# Patient Record
Sex: Female | Born: 1965 | Race: Black or African American | Hispanic: No | Marital: Single
Health system: Southern US, Community
[De-identification: ages and names within clinical notes are randomized; demographics above are authoritative.]

---

## 1998-04-12 ENCOUNTER — Ambulatory Visit (HOSPITAL_COMMUNITY): Admission: RE | Admit: 1998-04-12 | Discharge: 1998-04-12 | Payer: Self-pay | Admitting: Obstetrics and Gynecology

## 1998-08-31 ENCOUNTER — Inpatient Hospital Stay (HOSPITAL_COMMUNITY): Admission: AD | Admit: 1998-08-31 | Discharge: 1998-08-31 | Payer: Self-pay | Admitting: Obstetrics and Gynecology

## 1998-09-01 ENCOUNTER — Inpatient Hospital Stay (HOSPITAL_COMMUNITY): Admission: AD | Admit: 1998-09-01 | Discharge: 1998-09-01 | Payer: Self-pay | Admitting: Obstetrics and Gynecology

## 1998-09-04 ENCOUNTER — Inpatient Hospital Stay (HOSPITAL_COMMUNITY): Admission: AD | Admit: 1998-09-04 | Discharge: 1998-09-07 | Payer: Self-pay | Admitting: Obstetrics and Gynecology

## 1998-09-12 ENCOUNTER — Inpatient Hospital Stay (HOSPITAL_COMMUNITY): Admission: AD | Admit: 1998-09-12 | Discharge: 1998-09-14 | Payer: Self-pay | Admitting: Obstetrics and Gynecology

## 1998-09-12 ENCOUNTER — Encounter: Payer: Self-pay | Admitting: Pulmonary Disease

## 1998-09-12 ENCOUNTER — Encounter: Payer: Self-pay | Admitting: Obstetrics and Gynecology

## 1998-09-13 ENCOUNTER — Encounter: Payer: Self-pay | Admitting: Obstetrics and Gynecology

## 1998-09-13 ENCOUNTER — Encounter: Payer: Self-pay | Admitting: Pulmonary Disease

## 2000-08-14 ENCOUNTER — Other Ambulatory Visit: Admission: RE | Admit: 2000-08-14 | Discharge: 2000-08-14 | Payer: Self-pay | Admitting: Gynecology

## 2000-12-24 ENCOUNTER — Encounter: Admission: RE | Admit: 2000-12-24 | Discharge: 2000-12-24 | Payer: Self-pay | Admitting: Gynecology

## 2000-12-24 ENCOUNTER — Encounter: Payer: Self-pay | Admitting: Gynecology

## 2004-06-13 ENCOUNTER — Emergency Department (HOSPITAL_COMMUNITY): Admission: EM | Admit: 2004-06-13 | Discharge: 2004-06-14 | Payer: Self-pay | Admitting: Emergency Medicine

## 2005-10-23 ENCOUNTER — Other Ambulatory Visit: Admission: RE | Admit: 2005-10-23 | Discharge: 2005-10-23 | Payer: Self-pay | Admitting: Gynecology

## 2007-01-07 ENCOUNTER — Other Ambulatory Visit: Admission: RE | Admit: 2007-01-07 | Discharge: 2007-01-07 | Payer: Self-pay | Admitting: Gynecology

## 2007-03-02 ENCOUNTER — Encounter: Admission: RE | Admit: 2007-03-02 | Discharge: 2007-03-02 | Payer: Self-pay | Admitting: Family Medicine

## 2008-06-09 ENCOUNTER — Other Ambulatory Visit: Admission: RE | Admit: 2008-06-09 | Discharge: 2008-06-09 | Payer: Self-pay | Admitting: Gynecology

## 2008-06-09 ENCOUNTER — Encounter: Payer: Self-pay | Admitting: Women's Health

## 2008-06-09 ENCOUNTER — Ambulatory Visit: Payer: Self-pay | Admitting: Women's Health

## 2008-10-23 ENCOUNTER — Encounter (INDEPENDENT_AMBULATORY_CARE_PROVIDER_SITE_OTHER): Payer: Self-pay | Admitting: Surgery

## 2008-10-23 ENCOUNTER — Ambulatory Visit (HOSPITAL_COMMUNITY): Admission: RE | Admit: 2008-10-23 | Discharge: 2008-10-24 | Payer: Self-pay | Admitting: Surgery

## 2009-03-16 ENCOUNTER — Ambulatory Visit: Payer: Self-pay | Admitting: Women's Health

## 2009-09-19 ENCOUNTER — Ambulatory Visit: Payer: Self-pay | Admitting: Women's Health

## 2009-09-19 ENCOUNTER — Other Ambulatory Visit: Admission: RE | Admit: 2009-09-19 | Discharge: 2009-09-19 | Payer: Self-pay | Admitting: Gynecology

## 2009-10-04 ENCOUNTER — Ambulatory Visit: Payer: Self-pay | Admitting: Gynecology

## 2009-11-29 ENCOUNTER — Encounter: Admission: RE | Admit: 2009-11-29 | Discharge: 2009-11-29 | Payer: Self-pay | Admitting: Family Medicine

## 2010-07-31 ENCOUNTER — Ambulatory Visit
Admission: RE | Admit: 2010-07-31 | Discharge: 2010-07-31 | Payer: Self-pay | Source: Home / Self Care | Attending: Women's Health | Admitting: Women's Health

## 2010-09-23 ENCOUNTER — Encounter: Payer: Self-pay | Admitting: Women's Health

## 2010-10-03 ENCOUNTER — Other Ambulatory Visit: Payer: Self-pay | Admitting: Women's Health

## 2010-10-03 ENCOUNTER — Other Ambulatory Visit (HOSPITAL_COMMUNITY)
Admission: RE | Admit: 2010-10-03 | Discharge: 2010-10-03 | Disposition: A | Discharge status: Home / Self Care | Payer: BC Managed Care – PPO | Source: Ambulatory Visit | Attending: Gynecology | Admitting: Gynecology

## 2010-10-03 ENCOUNTER — Encounter (INDEPENDENT_AMBULATORY_CARE_PROVIDER_SITE_OTHER): Payer: BC Managed Care – PPO | Admitting: Women's Health

## 2010-10-03 DIAGNOSIS — Z01419 Encounter for gynecological examination (general) (routine) without abnormal findings: Secondary | ICD-10-CM

## 2010-10-03 DIAGNOSIS — Z124 Encounter for screening for malignant neoplasm of cervix: Secondary | ICD-10-CM | POA: Insufficient documentation

## 2010-10-16 LAB — URINALYSIS, ROUTINE W REFLEX MICROSCOPIC
Bilirubin Urine: NEGATIVE
Glucose, UA: NEGATIVE mg/dL
Hgb urine dipstick: NEGATIVE
Ketones, ur: NEGATIVE mg/dL
Nitrite: NEGATIVE
Protein, ur: NEGATIVE mg/dL
Specific Gravity, Urine: 1.017 (ref 1.005–1.030)
Urobilinogen, UA: 0.2 mg/dL (ref 0.0–1.0)
pH: 6.5 (ref 5.0–8.0)

## 2010-10-16 LAB — BASIC METABOLIC PANEL
BUN: 8 mg/dL (ref 6–23)
CO2: 28 mEq/L (ref 19–32)
Calcium: 10.8 mg/dL — ABNORMAL HIGH (ref 8.4–10.5)
Chloride: 107 mEq/L (ref 96–112)
Creatinine, Ser: 0.7 mg/dL (ref 0.4–1.2)
GFR calc Af Amer: >60 mL/min (ref >60)
GFR calc non Af Amer: >60 mL/min (ref >60)
Glucose, Bld: 108 mg/dL — ABNORMAL HIGH (ref 70–99)
Potassium: 4 mEq/L (ref 3.5–5.1)
Sodium: 140 mEq/L (ref 135–145)

## 2010-10-16 LAB — CBC
HCT: 35.3 % — ABNORMAL LOW (ref 36.0–46.0)
Hemoglobin: 11.5 g/dL — ABNORMAL LOW (ref 12.0–15.0)
MCHC: 32.7 g/dL (ref 30.0–36.0)
MCV: 80.1 fL (ref 78.0–100.0)
Platelets: 252 10*3/uL (ref 150–400)
RBC: 4.41 MIL/uL (ref 3.87–5.11)
RDW: 13.3 % (ref 11.5–15.5)
WBC: 5.9 10*3/uL (ref 4.0–10.5)

## 2010-10-16 LAB — DIFFERENTIAL
Basophils Absolute: 0 10*3/uL (ref 0.0–0.1)
Basophils Relative: 1 % (ref 0–1)
Eosinophils Absolute: 0.1 10*3/uL (ref 0.0–0.7)
Eosinophils Relative: 2 % (ref 0–5)
Lymphocytes Relative: 41 % (ref 12–46)
Lymphs Abs: 2.4 10*3/uL (ref 0.7–4.0)
Monocytes Absolute: 0.3 10*3/uL (ref 0.1–1.0)
Monocytes Relative: 5 % (ref 3–12)
Neutro Abs: 3 10*3/uL (ref 1.7–7.7)
Neutrophils Relative %: 51 % (ref 43–77)

## 2010-10-16 LAB — CALCIUM
Calcium: 9 mg/dL (ref 8.4–10.5)
Calcium: 9.9 mg/dL (ref 8.4–10.5)

## 2010-10-16 LAB — URINE MICROSCOPIC-ADD ON

## 2010-10-16 LAB — PROTIME-INR
INR: 1 (ref 0.00–1.49)
Prothrombin Time: 13.2 seconds (ref 11.6–15.2)

## 2010-10-16 LAB — GLUCOSE, CAPILLARY: Glucose-Capillary: 93 mg/dL (ref 70–99)

## 2010-11-19 NOTE — Op Note (Signed)
Paula Blair, WHITMIRE NO.:  [id number]   MEDICAL RECORD NO.:  [id number]          PATIENT TYPE:  OIB   LOCATION:  5118                         FACILITY:  [facility]   PHYSICIAN:  Velora Heckler, MD      DATE OF BIRTH:  1965-08-28   DATE OF PROCEDURE:  10/23/2008  DATE OF DISCHARGE:                               OPERATIVE REPORT   PREOPERATIVE DIAGNOSIS:  Primary hyperparathyroidism.   POSTOPERATIVE DIAGNOSIS:  Primary hyperparathyroidism.   PROCEDURE:  Left superior minimally invasive parathyroidectomy.   SURGEON:  Velora Heckler, MD, FACS   ASSISTANT:  Magnus Ivan, RNFA   ANESTHESIA:  General.   ESTIMATED BLOOD LOSS:  Minimal.   PREPARATION:  ChloraPrep.   COMPLICATIONS:  None.   INDICATIONS:  The patient is a 45 year old black female known to my  practice.  She had been previously evaluated in October 2008.  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  The patient had  deferred having surgery at that time.  She returned in January 2010 and  decided to proceed with surgical resection.  She now comes to the  operating room for minimally invasive parathyroidectomy.   BODY OF REPORT:  Procedure was done in OR #60 at the [facility].  The patient was brought to the operating room and  placed in supine position on the operating room table.  Following  administration of general anesthesia, the patient was positioned and  then prepped and draped in usual strict aseptic fashion.  After  ascertaining that an adequate level of anesthesia had been achieved, a  left neck incision was made with a #15 blade.  Dissection was carried  down through subcutaneous tissues and platysma.  Hemostasis was obtained  with electrocautery.  Subplatysmal flaps were developed  circumferentially and a Weitlaner retractor was placed for exposure.  Strap muscles were incised into the midline and reflected to the  patient's left.  Left thyroid  lobe was exposed.  It was gently mobilized  with blunt dissection.  Immediately posterior to the superior pole of  the left lobe, there was an abnormally enlarged parathyroid gland.  This  measures approximately 2.5 cm in greatest dimension.  It was gently  dissected out.  Vascular structures were divided between Ligaclips.  The  remainder of the gland was mobilized using the electrocautery for  hemostasis and it was completely excised in its entirety.  It was  submitted to pathology for review.  Dr. Guerry Bruin confirms parathyroid  tissue consistent with parathyroid adenoma.   Neck was irrigated with warm saline.  Good hemostasis was noted.  Surgicel was placed in the operative field.  Strap muscles were  reapproximated in the midline with interrupted 3-0 Vicryl sutures.  Platysma was closed with interrupted 3-0 Vicryl sutures.  Skin was  closed with a running 4-0 Monocryl subcuticular suture.  Local block was  placed with Marcaine.  Benzoin and Steri-Strips were applied.  Sterile  dressings were applied.  The patient was awakened from anesthesia and  brought to the recovery room in stable condition.  The patient tolerated  the procedure well.      Velora Heckler, MD  Electronically Signed     TMG/MEDQ  D:  10/23/2008  T:  10/24/2008  Job:  [id number]   cc:   Duncan Dull, M.D.

## 2010-12-16 ENCOUNTER — Other Ambulatory Visit: Payer: Self-pay | Admitting: Family Medicine

## 2010-12-16 DIAGNOSIS — Z1231 Encounter for screening mammogram for malignant neoplasm of breast: Secondary | ICD-10-CM

## 2010-12-23 ENCOUNTER — Ambulatory Visit
Admission: RE | Admit: 2010-12-23 | Discharge: 2010-12-23 | Disposition: A | Discharge status: Home / Self Care | Payer: BC Managed Care – PPO | Source: Ambulatory Visit | Attending: Family Medicine | Admitting: Family Medicine

## 2010-12-23 DIAGNOSIS — Z1231 Encounter for screening mammogram for malignant neoplasm of breast: Secondary | ICD-10-CM

## 2011-01-06 ENCOUNTER — Other Ambulatory Visit: Payer: Self-pay | Admitting: Gynecology

## 2011-01-06 DIAGNOSIS — R7989 Other specified abnormal findings of blood chemistry: Secondary | ICD-10-CM

## 2011-01-11 ENCOUNTER — Ambulatory Visit
Admission: RE | Admit: 2011-01-11 | Discharge: 2011-01-11 | Disposition: A | Discharge status: Home / Self Care | Payer: BC Managed Care – PPO | Source: Ambulatory Visit | Attending: Gynecology | Admitting: Gynecology

## 2011-01-11 DIAGNOSIS — R7989 Other specified abnormal findings of blood chemistry: Secondary | ICD-10-CM

## 2011-01-11 MED ORDER — GADOBENATE DIMEGLUMINE 529 MG/ML IV SOLN
10.0000 mL | Freq: Once | INTRAVENOUS | Status: AC | PRN
  Administered 2011-01-11: 10 mL via INTRAVENOUS

## 2011-01-15 ENCOUNTER — Other Ambulatory Visit: Payer: Self-pay | Admitting: Gynecology

## 2011-04-17 ENCOUNTER — Other Ambulatory Visit: Payer: Self-pay | Admitting: Women's Health

## 2011-04-30 ENCOUNTER — Encounter (HOSPITAL_COMMUNITY)
Admission: RE | Admit: 2011-04-30 | Discharge: 2011-04-30 | Disposition: A | Discharge status: Home / Self Care | Payer: BC Managed Care – PPO | Source: Ambulatory Visit | Attending: Neurosurgery | Admitting: Neurosurgery

## 2011-04-30 ENCOUNTER — Telehealth: Payer: Self-pay | Admitting: *Deleted

## 2011-04-30 ENCOUNTER — Other Ambulatory Visit (HOSPITAL_COMMUNITY): Payer: Self-pay | Admitting: Neurosurgery

## 2011-04-30 DIAGNOSIS — D497 Neoplasm of unspecified behavior of endocrine glands and other parts of nervous system: Secondary | ICD-10-CM

## 2011-04-30 LAB — BASIC METABOLIC PANEL
BUN: 14 mg/dL (ref 6–23)
Calcium: 10 mg/dL (ref 8.4–10.5)
Creatinine, Ser: 0.87 mg/dL (ref 0.50–1.10)
GFR calc non Af Amer: 80 mL/min — ABNORMAL LOW (ref >90)
Glucose, Bld: 119 mg/dL — ABNORMAL HIGH (ref 70–99)
Sodium: 142 mEq/L (ref 135–145)

## 2011-04-30 LAB — URINE MICROSCOPIC-ADD ON

## 2011-04-30 LAB — URINALYSIS, ROUTINE W REFLEX MICROSCOPIC
Bilirubin Urine: NEGATIVE
Glucose, UA: NEGATIVE mg/dL
Nitrite: NEGATIVE
Protein, ur: NEGATIVE mg/dL
Specific Gravity, Urine: 1.021 (ref 1.005–1.030)
Urobilinogen, UA: 0.2 mg/dL (ref 0.0–1.0)

## 2011-04-30 LAB — CBC
HCT: 32.5 % — ABNORMAL LOW (ref 36.0–46.0)
Hemoglobin: 10.6 g/dL — ABNORMAL LOW (ref 12.0–15.0)
MCH: 25.4 pg — ABNORMAL LOW (ref 26.0–34.0)
MCHC: 32.6 g/dL (ref 30.0–36.0)
MCV: 77.8 fL — ABNORMAL LOW (ref 78.0–100.0)
Platelets: 284 10*3/uL (ref 150–400)
RDW: 13.4 % (ref 11.5–15.5)
WBC: 6.8 10*3/uL (ref 4.0–10.5)

## 2011-04-30 LAB — PROTIME-INR
INR: 1 (ref 0.00–1.49)
Prothrombin Time: 13.4 seconds (ref 11.6–15.2)

## 2011-04-30 NOTE — Telephone Encounter (Signed)
Patient said she found out why has had trouble with her periods and wants to share the news with you.  No other details wants to speak.

## 2011-04-30 NOTE — Telephone Encounter (Signed)
Telephone call to inform that she did not take the Provera as prescribed in March when found to be not menopausal with amenorrhea. She saw her primary care, had an elevated prolactin. Has surgery scheduled for pituitary tumor.  She has had a history of ? endometrial polyps(2011) did not schedule hysteroscope, but the intermenstrual bleeding stopped.

## 2011-05-08 ENCOUNTER — Other Ambulatory Visit: Payer: Self-pay | Admitting: Neurosurgery

## 2011-05-08 ENCOUNTER — Inpatient Hospital Stay (HOSPITAL_COMMUNITY)
Admission: RE | Admit: 2011-05-08 | Discharge: 2011-05-12 | DRG: 286 | Disposition: A | Discharge status: Home / Self Care | Payer: BC Managed Care – PPO | Source: Ambulatory Visit | Attending: Neurosurgery | Admitting: Neurosurgery

## 2011-05-08 DIAGNOSIS — Z79899 Other long term (current) drug therapy: Secondary | ICD-10-CM

## 2011-05-08 DIAGNOSIS — I10 Essential (primary) hypertension: Secondary | ICD-10-CM | POA: Diagnosis present

## 2011-05-08 DIAGNOSIS — D353 Benign neoplasm of craniopharyngeal duct: Principal | ICD-10-CM | POA: Diagnosis present

## 2011-05-08 DIAGNOSIS — D497 Neoplasm of unspecified behavior of endocrine glands and other parts of nervous system: Secondary | ICD-10-CM

## 2011-05-08 DIAGNOSIS — D352 Benign neoplasm of pituitary gland: Principal | ICD-10-CM | POA: Diagnosis present

## 2011-05-08 LAB — BASIC METABOLIC PANEL
BUN: 9 mg/dL (ref 6–23)
CO2: 26 mEq/L (ref 19–32)
CO2: 27 mEq/L (ref 19–32)
Calcium: 9 mg/dL (ref 8.4–10.5)
Calcium: 9.2 mg/dL (ref 8.4–10.5)
Chloride: 110 mEq/L (ref 96–112)
Chloride: 101 mEq/L (ref 96–112)
Creatinine, Ser: 0.6 mg/dL (ref 0.50–1.10)
GFR calc Af Amer: >90 mL/min (ref >90)
GFR calc Af Amer: >90 mL/min (ref >90)
GFR calc non Af Amer: >90 mL/min (ref >90)
GFR calc non Af Amer: >90 mL/min (ref >90)
GFR calc non Af Amer: >90 mL/min (ref >90)
Glucose, Bld: 137 mg/dL — ABNORMAL HIGH (ref 70–99)
Glucose, Bld: 166 mg/dL — ABNORMAL HIGH (ref 70–99)
Potassium: 2.3 mEq/L — CL (ref 3.5–5.1)
Potassium: 3.3 mEq/L — ABNORMAL LOW (ref 3.5–5.1)
Potassium: 3.5 mEq/L (ref 3.5–5.1)
Sodium: 139 mEq/L (ref 135–145)
Sodium: 139 mEq/L (ref 135–145)

## 2011-05-08 LAB — ABO/RH: ABO/RH(D): A POS

## 2011-05-08 LAB — TYPE AND SCREEN
ABO/RH(D): A POS
Antibody Screen: NEGATIVE

## 2011-05-09 LAB — BASIC METABOLIC PANEL
BUN: 11 mg/dL (ref 6–23)
CO2: 25 mEq/L (ref 19–32)
Calcium: 9.5 mg/dL (ref 8.4–10.5)
Chloride: 106 mEq/L (ref 96–112)
Chloride: 106 mEq/L (ref 96–112)
Chloride: 106 mEq/L (ref 96–112)
Creatinine, Ser: 0.79 mg/dL (ref 0.50–1.10)
Creatinine, Ser: 0.71 mg/dL (ref 0.50–1.10)
GFR calc Af Amer: >90 mL/min (ref >90)
GFR calc Af Amer: >90 mL/min (ref >90)
GFR calc Af Amer: >90 mL/min (ref >90)
GFR calc non Af Amer: >90 mL/min (ref >90)
Potassium: 4.3 mEq/L (ref 3.5–5.1)
Potassium: 3.9 mEq/L (ref 3.5–5.1)
Sodium: 141 mEq/L (ref 135–145)
Sodium: 141 mEq/L (ref 135–145)

## 2011-05-09 MED ORDER — MORPHINE SULFATE 2 MG/ML IJ SOLN: 1.0000 mg | INTRAMUSCULAR | Status: DC | PRN

## 2011-05-09 MED ORDER — ACETAMINOPHEN 500 MG PO TABS
1000.0000 mg | ORAL_TABLET | ORAL | Status: DC | PRN
  Filled 2011-05-09: qty 2

## 2011-05-09 MED ORDER — HYDROCODONE-ACETAMINOPHEN 5-325 MG PO TABS
1.5000 | ORAL_TABLET | ORAL | Status: DC | PRN
  Administered 2011-05-11: 3 via ORAL

## 2011-05-09 MED ORDER — ONDANSETRON HCL 4 MG/2ML IJ SOLN: 4.0000 mg | Freq: Four times a day (QID) | INTRAMUSCULAR | Status: DC | PRN

## 2011-05-09 MED ORDER — FAMOTIDINE IN NACL 20-0.9 MG/50ML-% IV SOLN
20.0000 mg | Freq: Two times a day (BID) | INTRAVENOUS | Status: DC
  Filled 2011-05-09 (×4): qty 50

## 2011-05-09 MED ORDER — CEFAZOLIN SODIUM 1-5 GM-% IV SOLN
1.0000 g | Freq: Three times a day (TID) | INTRAVENOUS | Status: DC
  Filled 2011-05-09 (×6): qty 50

## 2011-05-09 MED ORDER — OXYCODONE-ACETAMINOPHEN 5-325 MG PO TABS
1.0000 | ORAL_TABLET | ORAL | Status: DC | PRN
Start: 2011-05-09 — End: 2011-05-12
  Administered 2011-05-11: 1 via ORAL
  Administered 2011-05-11 (×2): 2 via ORAL
  Filled 2011-05-09: qty 1

## 2011-05-09 MED ORDER — MUPIROCIN 2 % EX OINT
TOPICAL_OINTMENT | Freq: Two times a day (BID) | CUTANEOUS | Status: DC
  Administered 2011-05-11 – 2011-05-12 (×3): via NASAL
  Filled 2011-05-09: qty 22

## 2011-05-09 MED ORDER — DOCUSATE SODIUM 100 MG PO CAPS
100.0000 mg | ORAL_CAPSULE | Freq: Two times a day (BID) | ORAL | Status: DC
  Administered 2011-05-11 – 2011-05-12 (×3): 100 mg via ORAL
  Filled 2011-05-09 (×8): qty 1

## 2011-05-09 MED ORDER — VALACYCLOVIR HCL 500 MG PO TABS
500.0000 mg | ORAL_TABLET | Freq: Every day | ORAL | Status: DC | PRN
  Filled 2011-05-09: qty 1

## 2011-05-09 MED ORDER — SODIUM CHLORIDE 0.45 % IV SOLN
INTRAVENOUS | Status: DC
  Filled 2011-05-09 (×6): qty 1000

## 2011-05-09 MED ORDER — LISINOPRIL 40 MG PO TABS
40.0000 mg | ORAL_TABLET | Freq: Every day | ORAL | Status: DC
  Administered 2011-05-11 – 2011-05-12 (×2): 40 mg via ORAL
  Filled 2011-05-09 (×4): qty 1

## 2011-05-09 MED ORDER — PREDNISONE 5 MG PO TABS
5.0000 mg | ORAL_TABLET | Freq: Every day | ORAL | Status: AC
  Administered 2011-05-11: 5 mg via ORAL
  Filled 2011-05-09 (×2): qty 1

## 2011-05-09 MED ORDER — LABETALOL HCL 5 MG/ML IV SOLN: 5.0000 mg | INTRAVENOUS | Status: DC | PRN

## 2011-05-09 MED ORDER — PREDNISONE 5 MG PO TABS
5.0000 mg | ORAL_TABLET | Freq: Two times a day (BID) | ORAL | Status: AC
  Filled 2011-05-09 (×2): qty 1

## 2011-05-09 MED ORDER — AMLODIPINE BESYLATE 5 MG PO TABS
5.0000 mg | ORAL_TABLET | Freq: Every day | ORAL | Status: DC
  Administered 2011-05-11 – 2011-05-12 (×2): 5 mg via ORAL
  Filled 2011-05-09 (×4): qty 1

## 2011-05-09 MED ORDER — THERA M PLUS PO TABS
1.0000 | ORAL_TABLET | Freq: Every day | ORAL | Status: DC
  Administered 2011-05-11 – 2011-05-12 (×2): 1 via ORAL
  Filled 2011-05-09 (×4): qty 1

## 2011-05-09 MED ORDER — FAMOTIDINE 20 MG PO TABS
20.0000 mg | ORAL_TABLET | Freq: Two times a day (BID) | ORAL | Status: DC
  Administered 2011-05-11 – 2011-05-12 (×3): 20 mg via ORAL
  Filled 2011-05-09 (×6): qty 1

## 2011-05-09 MED ORDER — SALINE SPRAY 0.65 % NA SOLN
1.0000 | Freq: Four times a day (QID) | NASAL | Status: DC
  Administered 2011-05-11 – 2011-05-12 (×5): 1 via NASAL
  Filled 2011-05-09: qty 44

## 2011-05-09 MED ORDER — SENNOSIDES-DOCUSATE SODIUM 8.6-50 MG PO TABS
1.0000 | ORAL_TABLET | Freq: Every evening | ORAL | Status: DC | PRN
  Administered 2011-05-11: 1 via ORAL
  Filled 2011-05-09: qty 1

## 2011-05-10 LAB — BASIC METABOLIC PANEL
CO2: 32 mEq/L (ref 19–32)
Calcium: 9.6 mg/dL (ref 8.4–10.5)
Calcium: 9.5 mg/dL (ref 8.4–10.5)
Chloride: 102 mEq/L (ref 96–112)
GFR calc Af Amer: >90 mL/min (ref >90)
GFR calc non Af Amer: >90 mL/min (ref >90)
GFR calc non Af Amer: >90 mL/min (ref >90)
Potassium: 4.5 mEq/L (ref 3.5–5.1)
Potassium: 3.9 mEq/L (ref 3.5–5.1)
Potassium: 3.9 mEq/L (ref 3.5–5.1)
Sodium: 142 mEq/L (ref 135–145)
Sodium: 141 mEq/L (ref 135–145)
Sodium: 141 mEq/L (ref 135–145)

## 2011-05-10 MED ORDER — SODIUM CHLORIDE 0.9 % IJ SOLN
3.0000 mL | Freq: Two times a day (BID) | INTRAMUSCULAR | Status: DC
  Administered 2011-05-11 – 2011-05-12 (×3): 3 mL via INTRAVENOUS
  Filled 2011-05-10 (×2): qty 3

## 2011-05-11 LAB — BASIC METABOLIC PANEL
CO2: 33 mEq/L — ABNORMAL HIGH (ref 19–32)
Chloride: 100 mEq/L (ref 96–112)
GFR calc Af Amer: >90 mL/min (ref >90)
GFR calc non Af Amer: >90 mL/min (ref >90)
Glucose, Bld: 106 mg/dL — ABNORMAL HIGH (ref 70–99)
Potassium: 3.5 mEq/L (ref 3.5–5.1)
Potassium: 3.3 mEq/L — ABNORMAL LOW (ref 3.5–5.1)
Sodium: 141 mEq/L (ref 135–145)
Sodium: 141 mEq/L (ref 135–145)

## 2011-05-11 NOTE — Progress Notes (Signed)
  Subjective: Patient reports no headache.  Objective: Vital signs in last 24 hours: Temp:  [97.3 F (36.3 C)-98.3 F (36.8 C)] 98.3 F (36.8 C) (11/04 1346) Pulse Rate:  [57-72] 64  (11/04 1346) Resp:  [17-18] 17  (11/04 1346) BP: (118-135)/(73-81) 118/73 mmHg (11/04 1346) SpO2:  [94 %-99 %] 99 % (11/04 1346) FiO2 (%):  [28 %] 28 % (11/04 1346)  Intake/Output from previous day:   Intake/Output this shift: Total I/O In: 650 [P.O.:650] Out: -   No headache or visual abnormalities. Wound C/D/I  Lab Results: No results found for this basename: WBC:2,HGB:2,HCT:2,PLT:2 in the last 72 hours BMET  [facility] 05/11/11 0854 05/10/11 2204  NA 141 141  K 3.5 3.9  CL 100 102  CO2 32 32  GLUCOSE 85 106*  BUN 16 16  CREATININE 0.78 0.71  CALCIUM 9.2 9.5    Studies/Results: No results found.  Assessment/Plan: No DI. Doing well s/p transsphenoidal.  Anticipate D/C in am by Dr. Phoebe Perch.   LOS: 3 days     Dorian Heckle, MD 05/11/2011, 4:53 PM

## 2011-05-12 DIAGNOSIS — D497 Neoplasm of unspecified behavior of endocrine glands and other parts of nervous system: Secondary | ICD-10-CM

## 2011-05-12 LAB — BASIC METABOLIC PANEL
BUN: 14 mg/dL (ref 6–23)
Chloride: 100 mEq/L (ref 96–112)
GFR calc Af Amer: >90 mL/min (ref >90)
Potassium: 3.1 mEq/L — ABNORMAL LOW (ref 3.5–5.1)

## 2011-05-12 MED ORDER — HYDROCODONE-IBUPROFEN 7.5-200 MG PO TABS: 1.0000 | ORAL_TABLET | Freq: Four times a day (QID) | ORAL | Status: DC | PRN

## 2011-05-12 MED ORDER — HYDROCODONE-IBUPROFEN 7.5-200 MG PO TABS: 1.0000 | ORAL_TABLET | Freq: Four times a day (QID) | ORAL | Status: AC | PRN

## 2011-05-12 NOTE — Progress Notes (Signed)
Pt d/c home by car with family, assessment stable, prescriptions given

## 2011-05-12 NOTE — Discharge Summary (Signed)
  Physician Discharge Summary  Patient ID: Paula Blair MRN: [id number] DOB/AGE: 1966-06-12 45 y.o.  Admit date: 05/08/2011 Discharge date: 05/12/2011  Admission Diagnoses: pituitary tumor  Discharge Diagnoses: same Active Problems:  * No active hospital problems. *    Discharged Condition: good  Hospital Course: Patient was admitted on the day of surgery and underwent transsphenoidal pituitary resection with Dr. Annalee Genta. Postoperatively she was transferred to the recovery room and to the ICU.  Patient did well mild headache mild drainage from her nose it was diminishing each day. She did not to develop DI, the visual fields were intact, extraocular movements intact vision grossly intact and was neurologically intact through her hospital stay.  The electrolytes remained within normal limits throughout stay. Patient was discharged home stable condition follow up with the next couple days with Dr. Annalee Genta to have packing removed followup with me in a few weeks. she'll get him am cortisol later this week since she has been taken off the postoperative steroids and normal prior to admission.  Consults: Dr. Annalee Genta  Significant Diagnostic Studies: path pending  Treatments: surgery: Transsphenoidal pituitary tumor removal  Discharge Exam: Blood pressure 112/71, pulse 62, temperature 98 F (36.7 C), temperature source Oral, resp. rate 18, height 5\' 5"  (1.651 m), weight 82 kg (180 lb 12.4 oz), SpO2 100.00%. Neuro exam is intact with good extraocular movements intact  Disposition: Home   Current Discharge Medication List    CONTINUE these medications which have NOT CHANGED   Details  amLODipine (NORVASC) 5 MG tablet Take 5 mg by mouth daily.      amoxicillin (AMOXIL) 875 MG tablet Take 875 mg by mouth 2 (two) times daily.      lisinopril (PRINIVIL,ZESTRIL) 40 MG tablet Take 40 mg by mouth daily.      Multiple Vitamins-Minerals (MULTIVITAMINS THER. W/MINERALS) TABS Take 1  tablet by mouth daily.      valACYclovir (VALTREX) 500 MG tablet Take 500 mg by mouth daily as needed. For outbreak          Signed: Clydene Fake, MD 05/12/2011, 9:50 AM

## 2011-05-12 NOTE — Op Note (Signed)
NAMEBAKER, KOGLER NO.:  [id number]  MEDICAL RECORD NO.:  [id number]  LOCATION:  3109                         FACILITY:  [facility]  PHYSICIAN:  Clydene Fake, M.D.  DATE OF BIRTH:  March 11, 1966  DATE OF PROCEDURE:  05/08/2011 DATE OF DISCHARGE:                              OPERATIVE REPORT   DIAGNOSIS:  Pituitary tumor.  POSTOP DIAGNOSIS:  Pituitary tumor.  PROCEDURE:  Transsphenoidal resection of pituitary tumor, frameless Stealth stereotactic assistance.  SURGEON:  Clydene Fake, MD  CO-SURGEONKinnie Scales. Annalee Genta, MD  ANESTHESIA:  General endotracheal tube anesthesia.  ESTIMATED BLOOD LOSS:  Minimal.  BLOOD GIVEN:  None.  DRAINS:  None.  PATHOLOGY:  Tumor sent for both permanent section and cytology.  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  Endocrine workup was essentially normal.  Couple of the labs showed just very minimally they are low.  Prolactin just slightly elevated off in comparison to her pituitary compression and stalk effect.  Visual fields are intact.  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 Genetic testing for N1 was done, which was negative.  PROCEDURE IN DETAIL:  The patient was brought to the operating room and general anesthesia was induced.  The patient then had the exposure into the sphenoid sinus and to the base of sella.  It was performed by Dr. Annalee Genta which will be dictated under separate dictation as intraoperative Stealth stereotaxis was used.  Once we had good exposure of the sellar area, we did re-explore the sella using the Stealth, showed we had a good exposure of the sella.  The sellar floor  was on admission in the anterior medial aspect, and we carefully bipolared the mucosa and then using Kerrison punches enlarged the sellar opening over the dura into the sellar region.  We then placed a small spinal needle on a Toomey syringe into the sella, and we aspirated dark necrotic liquified tumor, and this was sent for cytology.  Dura was then opened in a cruciate fashion and the dural edges bipolared for hemostasis, and we could see liquified tumor.  This was suctioned carefully and some of the suction aspirate was sent to Pathology.  Then using __________ ring curettes, we removed tumor around the edges.  There was some tumor and these tumor pieces were sent for pathology also.  We can see what we thought was pituitary gland towards the right and anterior.  We thought that it may be based on the MRI also.  We did not have a lot of shrinkage of the sellar cavity after the tumor bed, but we did have some.  Hemostasis was obtained with Gelfoam and thrombin, which was then removed.  We irrigated with antibiotic solution.  We had good hemostasis.  There is no more obvious tumor.  We used 2 pieces of bone cartilage from the exposure and placed them at the bottom of  the sella and reconstruct the sellar floor, placed a piece of DuraGen over this and then sealed it with Tisseel tissue glue.  Dr. Annalee Genta then dictated the closure of the case in a separate dictation.  We did use the Stealth for intraop cranial stereotactic assistance.          ______________________________ Clydene Fake, M.D.     JRH/MEDQ  D:  05/08/2011  T:  05/09/2011  Job:  [id number]  Electronically Signed by Colon Branch M.D. on 05/12/2011 10:10:45 AM

## 2012-04-30 ENCOUNTER — Other Ambulatory Visit: Payer: Self-pay | Admitting: Women's Health

## 2012-05-03 NOTE — Telephone Encounter (Signed)
Front desk will attempt to reach patient to scheduled CE.

## 2012-08-17 ENCOUNTER — Other Ambulatory Visit: Payer: Self-pay | Admitting: Gynecology

## 2012-08-17 DIAGNOSIS — N6320 Unspecified lump in the left breast, unspecified quadrant: Secondary | ICD-10-CM

## 2012-08-27 ENCOUNTER — Other Ambulatory Visit: Payer: BC Managed Care – PPO

## 2012-09-08 ENCOUNTER — Other Ambulatory Visit: Payer: BC Managed Care – PPO

## 2012-10-19 ENCOUNTER — Ambulatory Visit
Admission: RE | Admit: 2012-10-19 | Discharge: 2012-10-19 | Disposition: A | Discharge status: Home / Self Care | Payer: BC Managed Care – PPO | Source: Ambulatory Visit | Attending: Gynecology | Admitting: Gynecology

## 2012-10-19 DIAGNOSIS — N6320 Unspecified lump in the left breast, unspecified quadrant: Secondary | ICD-10-CM

## 2014-03-07 ENCOUNTER — Other Ambulatory Visit: Payer: Self-pay | Admitting: Gynecology

## 2014-03-07 DIAGNOSIS — R928 Other abnormal and inconclusive findings on diagnostic imaging of breast: Secondary | ICD-10-CM

## 2014-03-29 ENCOUNTER — Ambulatory Visit
Admission: RE | Admit: 2014-03-29 | Discharge: 2014-03-29 | Disposition: A | Discharge status: Home / Self Care | Payer: BC Managed Care – PPO | Source: Ambulatory Visit | Attending: Gynecology | Admitting: Gynecology

## 2014-03-29 ENCOUNTER — Encounter (INDEPENDENT_AMBULATORY_CARE_PROVIDER_SITE_OTHER): Payer: Self-pay

## 2014-03-29 DIAGNOSIS — R928 Other abnormal and inconclusive findings on diagnostic imaging of breast: Secondary | ICD-10-CM

## 2014-11-20 ENCOUNTER — Other Ambulatory Visit: Payer: Self-pay | Admitting: Gynecology

## 2014-11-20 DIAGNOSIS — N63 Unspecified lump in unspecified breast: Secondary | ICD-10-CM

## 2014-11-28 ENCOUNTER — Other Ambulatory Visit: Payer: Self-pay

## 2014-12-05 ENCOUNTER — Ambulatory Visit
Admission: RE | Admit: 2014-12-05 | Discharge: 2014-12-05 | Disposition: A | Discharge status: Home / Self Care | Payer: BLUE CROSS/BLUE SHIELD | Source: Ambulatory Visit | Attending: Gynecology | Admitting: Gynecology

## 2014-12-05 DIAGNOSIS — N63 Unspecified lump in unspecified breast: Secondary | ICD-10-CM

## 2015-04-04 ENCOUNTER — Other Ambulatory Visit: Payer: Self-pay | Admitting: Gynecology

## 2015-04-04 DIAGNOSIS — N6310 Unspecified lump in the right breast, unspecified quadrant: Secondary | ICD-10-CM

## 2015-04-10 ENCOUNTER — Ambulatory Visit
Admission: RE | Admit: 2015-04-10 | Discharge: 2015-04-10 | Disposition: A | Discharge status: Home / Self Care | Payer: BLUE CROSS/BLUE SHIELD | Source: Ambulatory Visit | Attending: Gynecology | Admitting: Gynecology

## 2015-04-10 ENCOUNTER — Other Ambulatory Visit: Payer: Self-pay | Admitting: Obstetrics and Gynecology

## 2015-04-10 DIAGNOSIS — N6310 Unspecified lump in the right breast, unspecified quadrant: Secondary | ICD-10-CM

## 2016-04-18 ENCOUNTER — Other Ambulatory Visit: Payer: Self-pay | Admitting: Family Medicine

## 2016-04-18 DIAGNOSIS — Z1231 Encounter for screening mammogram for malignant neoplasm of breast: Secondary | ICD-10-CM

## 2016-04-22 ENCOUNTER — Ambulatory Visit
Admission: RE | Admit: 2016-04-22 | Discharge: 2016-04-22 | Disposition: A | Discharge status: Home / Self Care | Payer: BLUE CROSS/BLUE SHIELD | Source: Ambulatory Visit | Attending: Family Medicine | Admitting: Family Medicine

## 2016-04-22 DIAGNOSIS — Z1231 Encounter for screening mammogram for malignant neoplasm of breast: Secondary | ICD-10-CM

## 2017-11-16 DIAGNOSIS — E876 Hypokalemia: Secondary | ICD-10-CM | POA: Diagnosis not present

## 2018-02-10 ENCOUNTER — Other Ambulatory Visit: Payer: Self-pay | Admitting: Family Medicine

## 2018-02-10 DIAGNOSIS — I10 Essential (primary) hypertension: Secondary | ICD-10-CM | POA: Diagnosis not present

## 2018-02-10 DIAGNOSIS — E781 Pure hyperglyceridemia: Secondary | ICD-10-CM | POA: Diagnosis not present

## 2018-02-10 DIAGNOSIS — Z1231 Encounter for screening mammogram for malignant neoplasm of breast: Secondary | ICD-10-CM

## 2018-03-12 ENCOUNTER — Ambulatory Visit: Payer: BLUE CROSS/BLUE SHIELD

## 2018-04-12 ENCOUNTER — Ambulatory Visit
Admission: RE | Admit: 2018-04-12 | Discharge: 2018-04-12 | Disposition: A | Discharge status: Home / Self Care | Payer: Self-pay | Source: Ambulatory Visit | Attending: Family Medicine | Admitting: Family Medicine

## 2018-04-12 DIAGNOSIS — Z1231 Encounter for screening mammogram for malignant neoplasm of breast: Secondary | ICD-10-CM

## 2018-05-28 DIAGNOSIS — J019 Acute sinusitis, unspecified: Secondary | ICD-10-CM | POA: Diagnosis not present

## 2018-05-28 DIAGNOSIS — R52 Pain, unspecified: Secondary | ICD-10-CM | POA: Diagnosis not present

## 2018-05-28 DIAGNOSIS — J209 Acute bronchitis, unspecified: Secondary | ICD-10-CM | POA: Diagnosis not present

## 2018-07-30 DIAGNOSIS — I10 Essential (primary) hypertension: Secondary | ICD-10-CM | POA: Diagnosis not present

## 2018-07-30 DIAGNOSIS — J018 Other acute sinusitis: Secondary | ICD-10-CM | POA: Diagnosis not present

## 2018-09-06 DIAGNOSIS — J019 Acute sinusitis, unspecified: Secondary | ICD-10-CM | POA: Diagnosis not present

## 2018-09-06 DIAGNOSIS — I10 Essential (primary) hypertension: Secondary | ICD-10-CM | POA: Diagnosis not present

## 2019-03-17 DIAGNOSIS — M62838 Other muscle spasm: Secondary | ICD-10-CM | POA: Diagnosis not present

## 2019-03-17 DIAGNOSIS — Z79899 Other long term (current) drug therapy: Secondary | ICD-10-CM | POA: Diagnosis not present

## 2019-03-17 DIAGNOSIS — I10 Essential (primary) hypertension: Secondary | ICD-10-CM | POA: Diagnosis not present

## 2019-03-17 DIAGNOSIS — Z8249 Family history of ischemic heart disease and other diseases of the circulatory system: Secondary | ICD-10-CM | POA: Diagnosis not present

## 2019-08-12 DIAGNOSIS — J0100 Acute maxillary sinusitis, unspecified: Secondary | ICD-10-CM | POA: Diagnosis not present

## 2019-08-12 DIAGNOSIS — I10 Essential (primary) hypertension: Secondary | ICD-10-CM | POA: Diagnosis not present

## 2019-08-16 DIAGNOSIS — Z20828 Contact with and (suspected) exposure to other viral communicable diseases: Secondary | ICD-10-CM | POA: Diagnosis not present

## 2019-08-16 DIAGNOSIS — Z03818 Encounter for observation for suspected exposure to other biological agents ruled out: Secondary | ICD-10-CM | POA: Diagnosis not present

## 2019-08-25 DIAGNOSIS — Z20828 Contact with and (suspected) exposure to other viral communicable diseases: Secondary | ICD-10-CM | POA: Diagnosis not present

## 2019-08-25 DIAGNOSIS — Z03818 Encounter for observation for suspected exposure to other biological agents ruled out: Secondary | ICD-10-CM | POA: Diagnosis not present

## 2020-01-31 DIAGNOSIS — R05 Cough: Secondary | ICD-10-CM | POA: Diagnosis not present

## 2020-01-31 DIAGNOSIS — Z20822 Contact with and (suspected) exposure to covid-19: Secondary | ICD-10-CM | POA: Diagnosis not present

## 2020-01-31 DIAGNOSIS — J069 Acute upper respiratory infection, unspecified: Secondary | ICD-10-CM | POA: Diagnosis not present

## 2020-01-31 DIAGNOSIS — R0981 Nasal congestion: Secondary | ICD-10-CM | POA: Diagnosis not present

## 2021-01-30 ENCOUNTER — Other Ambulatory Visit: Payer: Self-pay | Admitting: Physician Assistant

## 2021-01-30 DIAGNOSIS — Z1231 Encounter for screening mammogram for malignant neoplasm of breast: Secondary | ICD-10-CM

## 2021-02-18 ENCOUNTER — Ambulatory Visit
Admission: RE | Admit: 2021-02-18 | Discharge: 2021-02-18 | Disposition: A | Discharge status: Home / Self Care | Payer: BC Managed Care – PPO | Source: Ambulatory Visit | Attending: Physician Assistant | Admitting: Physician Assistant

## 2021-02-18 ENCOUNTER — Other Ambulatory Visit: Payer: Self-pay

## 2021-02-18 DIAGNOSIS — Z1231 Encounter for screening mammogram for malignant neoplasm of breast: Secondary | ICD-10-CM

## 2022-07-23 ENCOUNTER — Other Ambulatory Visit: Payer: Self-pay | Admitting: Physician Assistant

## 2022-07-23 DIAGNOSIS — Z1231 Encounter for screening mammogram for malignant neoplasm of breast: Secondary | ICD-10-CM

## 2022-09-11 ENCOUNTER — Ambulatory Visit
Admission: RE | Admit: 2022-09-11 | Discharge: 2022-09-11 | Disposition: A | Discharge status: Home / Self Care | Payer: 59 | Source: Ambulatory Visit | Attending: Physician Assistant | Admitting: Physician Assistant

## 2022-09-11 DIAGNOSIS — Z1231 Encounter for screening mammogram for malignant neoplasm of breast: Secondary | ICD-10-CM

## 2023-08-15 IMAGING — MG MM DIGITAL SCREENING BILAT W/ TOMO AND CAD
6 of 12 series · 6 of 36 positions shown · non-contrast
Comparison: Previous exam(s).

CLINICAL DATA: Screening.

EXAM:
DIGITAL SCREENING BILATERAL MAMMOGRAM WITH TOMOSYNTHESIS AND CAD
TECHNIQUE: Bilateral screening digital craniocaudal and mediolateral oblique
mammograms were obtained. Bilateral screening digital breast
tomosynthesis was performed. The images were evaluated with
computer-aided detection.

[R CC synth-2D]
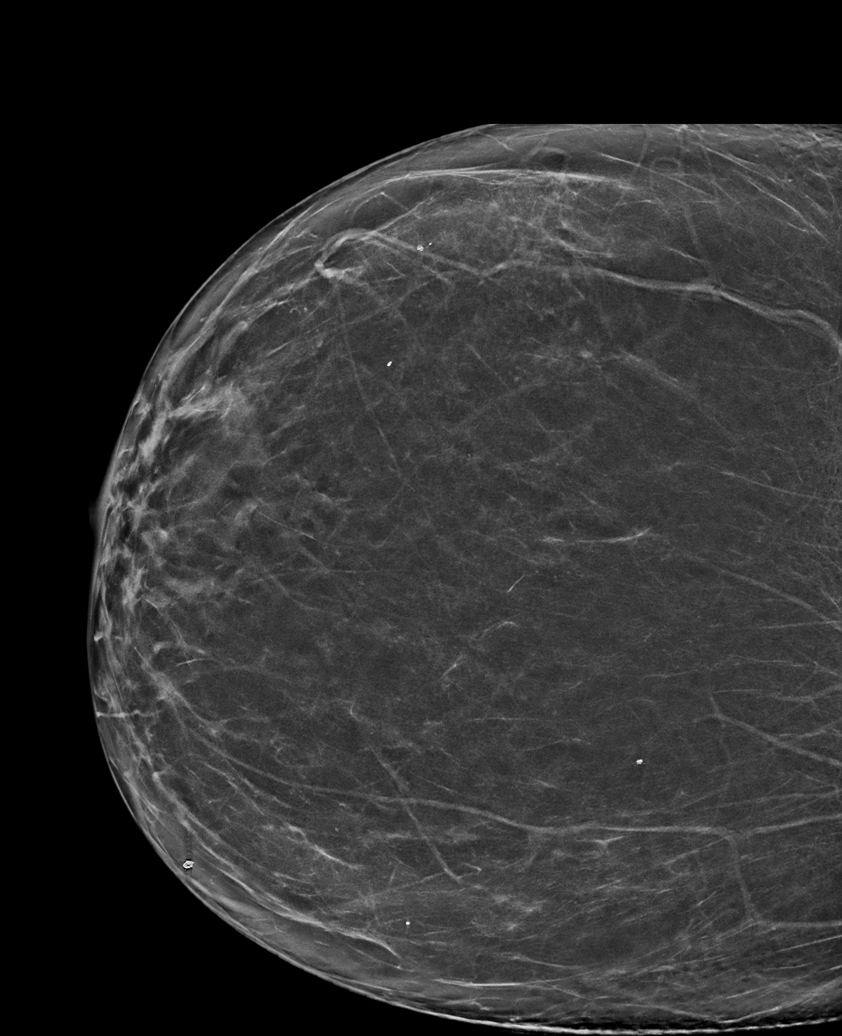

[R MLO synth-2D]
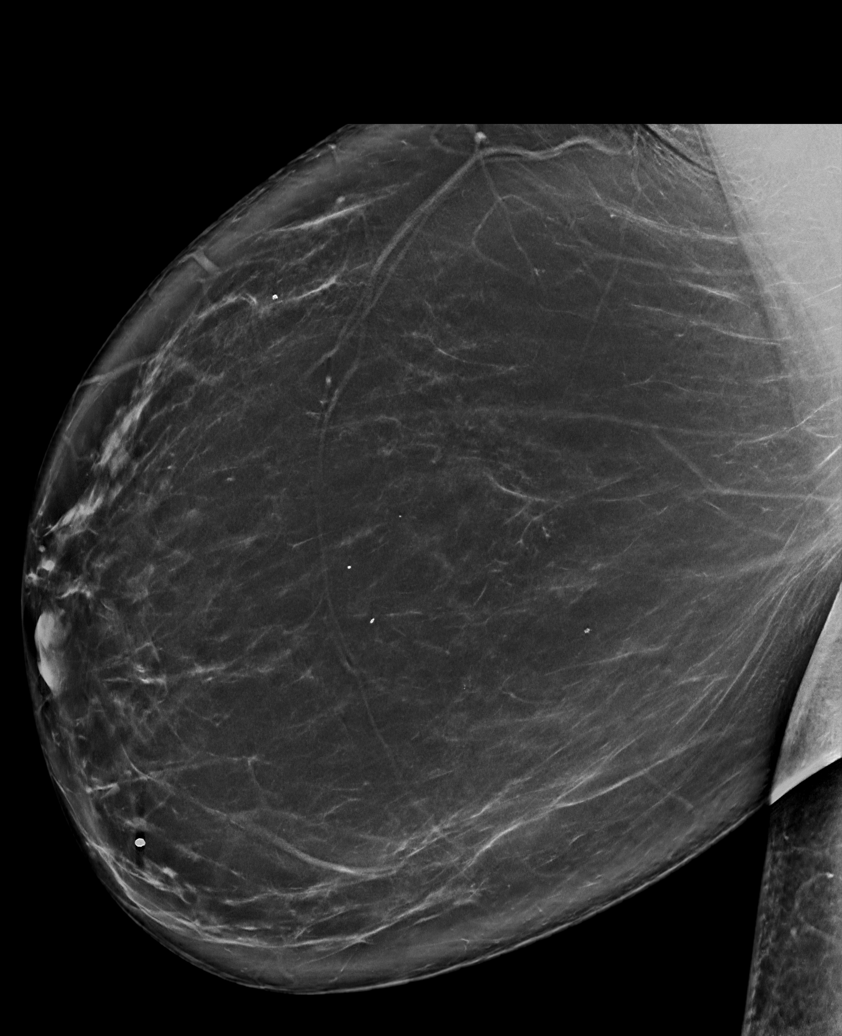

[L CC synth-2D]
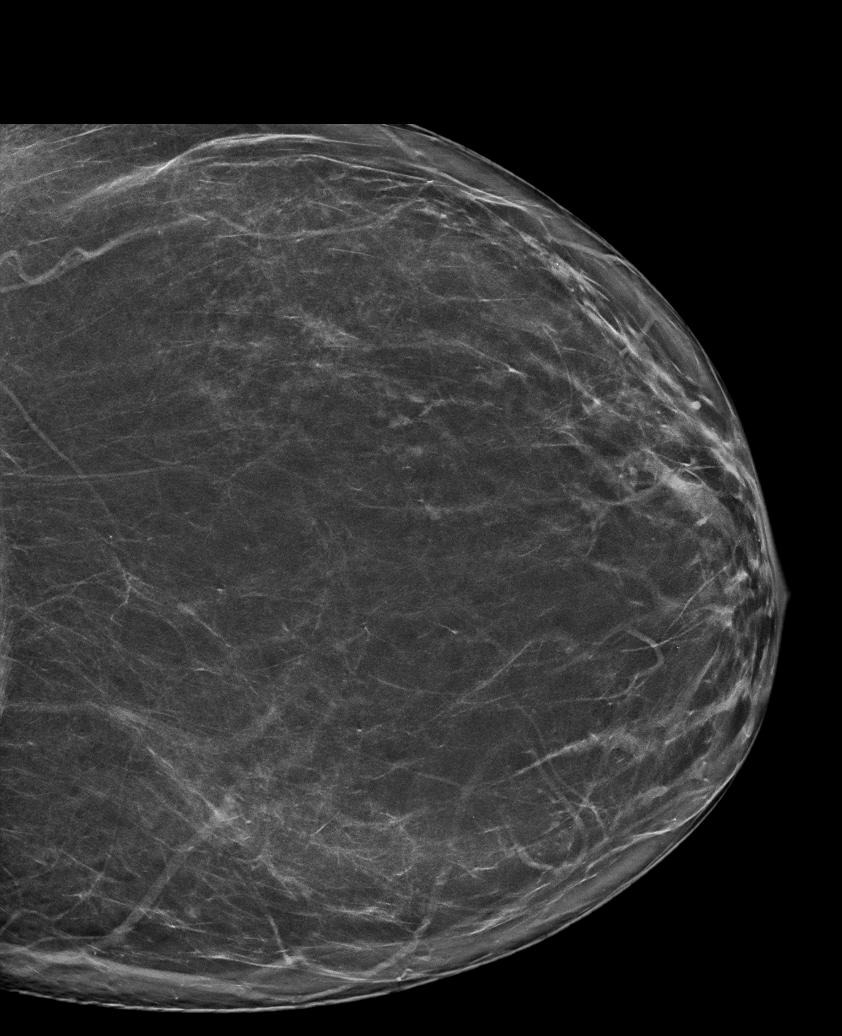

[L MLO synth-2D (1 of 2)]
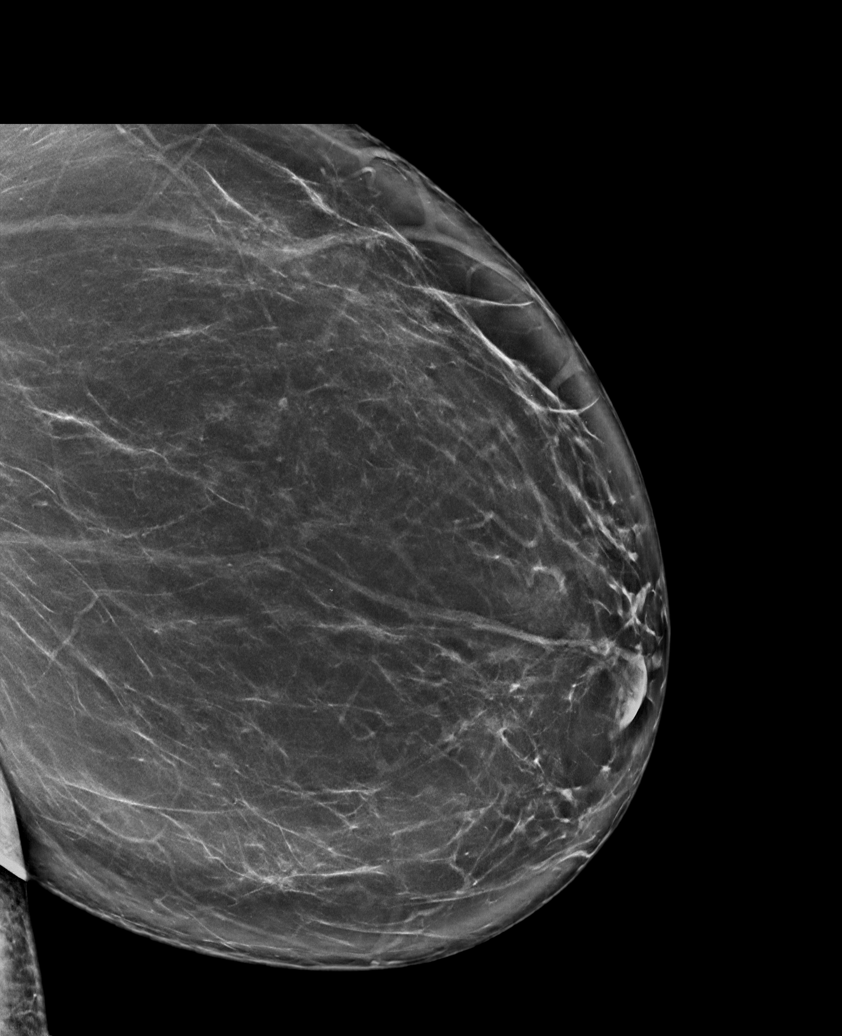

[R CV synth-2D]
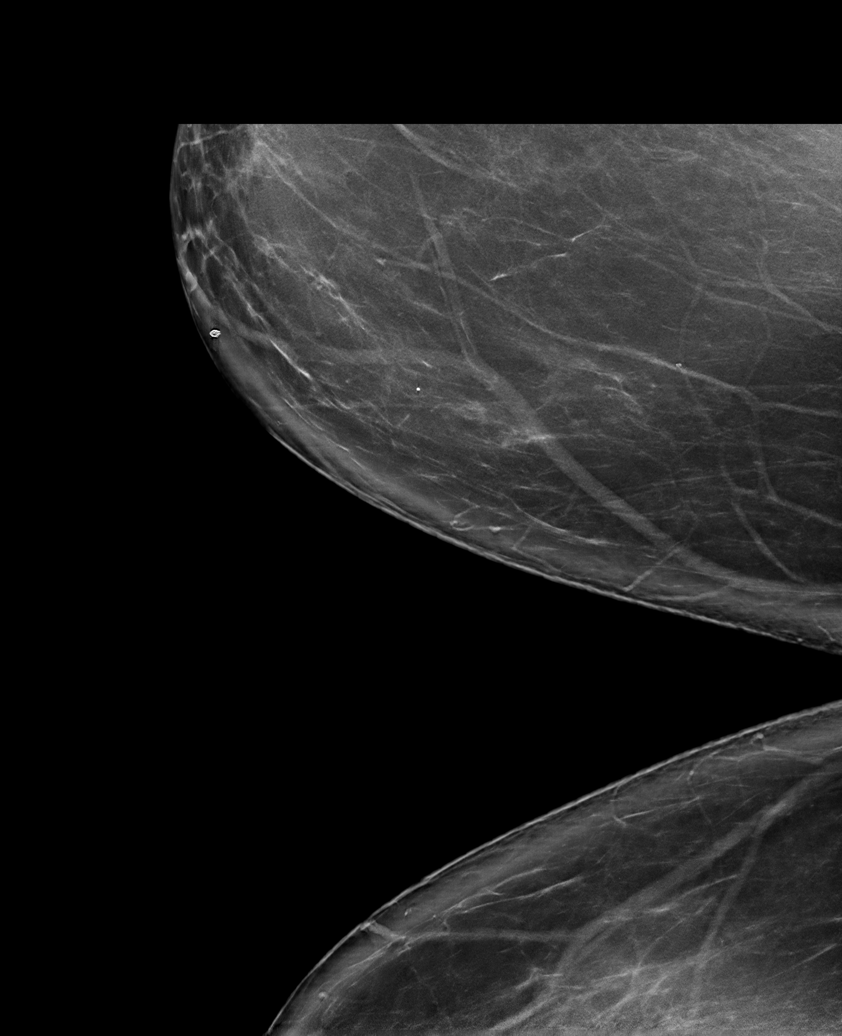

[L MLO synth-2D (2 of 2)]
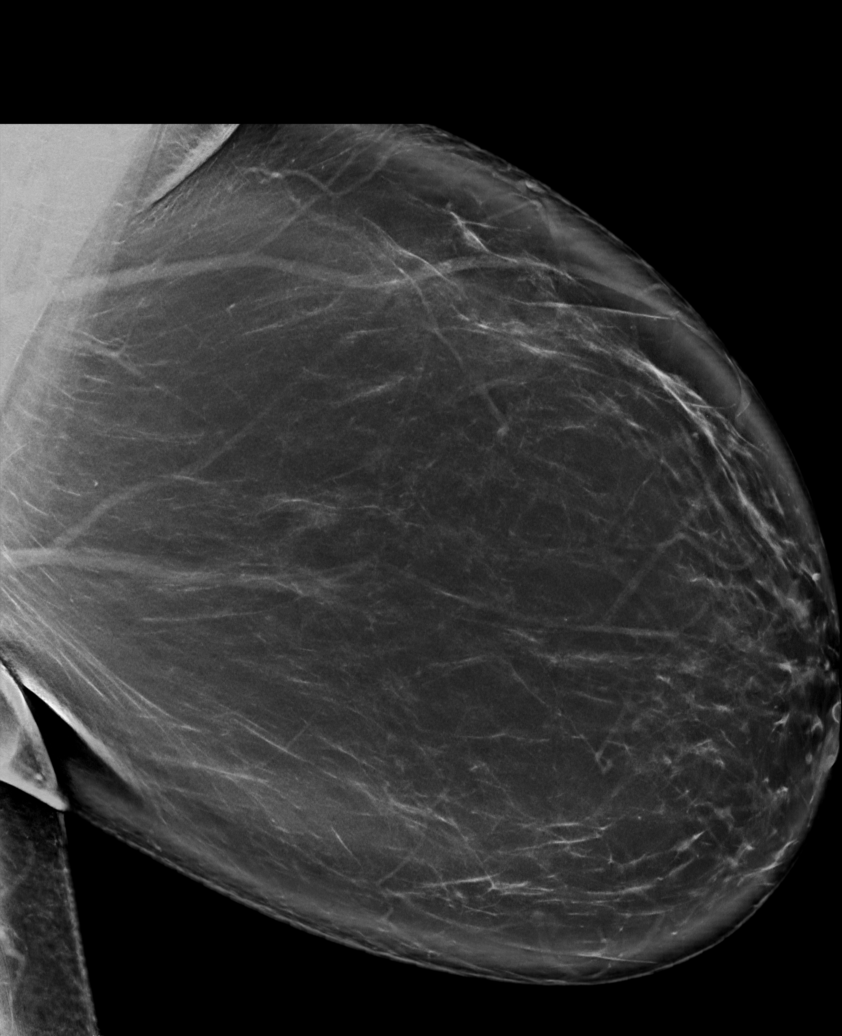

[6 of 36 positions shown; findings below may reference images not displayed]

ACR Breast Density Category b: There are scattered areas of
fibroglandular density.
FINDINGS: There are no findings suspicious for malignancy.
IMPRESSION: No mammographic evidence of malignancy. A result letter of this
screening mammogram will be mailed directly to the patient.

RECOMMENDATION:
Screening mammogram in one year. (Code:51-O-LD2)

BI-RADS CATEGORY  1: Negative.
